# Patient Record
Sex: Female | Born: 1970
Health system: Southern US, Community
[De-identification: ages and names within clinical notes are randomized; demographics above are authoritative.]

## PROBLEM LIST (undated history)

## (undated) DIAGNOSIS — I10 Essential (primary) hypertension: Secondary | ICD-10-CM

---

## 2018-12-05 DIAGNOSIS — E785 Hyperlipidemia, unspecified: Secondary | ICD-10-CM | POA: Diagnosis not present

## 2018-12-05 DIAGNOSIS — R42 Dizziness and giddiness: Secondary | ICD-10-CM | POA: Diagnosis not present

## 2018-12-05 DIAGNOSIS — I10 Essential (primary) hypertension: Secondary | ICD-10-CM | POA: Diagnosis not present

## 2018-12-29 DIAGNOSIS — Z01419 Encounter for gynecological examination (general) (routine) without abnormal findings: Secondary | ICD-10-CM | POA: Diagnosis not present

## 2019-01-03 ENCOUNTER — Other Ambulatory Visit: Payer: Self-pay | Admitting: Obstetrics & Gynecology

## 2019-01-03 DIAGNOSIS — Z1231 Encounter for screening mammogram for malignant neoplasm of breast: Secondary | ICD-10-CM

## 2019-02-23 ENCOUNTER — Other Ambulatory Visit: Payer: Self-pay

## 2019-02-23 ENCOUNTER — Ambulatory Visit
Admission: RE | Admit: 2019-02-23 | Discharge: 2019-02-23 | Disposition: A | Discharge status: Home / Self Care | Payer: Self-pay | Source: Ambulatory Visit | Attending: Obstetrics & Gynecology | Admitting: Obstetrics & Gynecology

## 2019-02-23 DIAGNOSIS — Z1231 Encounter for screening mammogram for malignant neoplasm of breast: Secondary | ICD-10-CM | POA: Diagnosis not present

## 2019-06-07 DIAGNOSIS — Z0000 Encounter for general adult medical examination without abnormal findings: Secondary | ICD-10-CM | POA: Diagnosis not present

## 2019-06-07 DIAGNOSIS — E785 Hyperlipidemia, unspecified: Secondary | ICD-10-CM | POA: Diagnosis not present

## 2019-06-07 DIAGNOSIS — E559 Vitamin D deficiency, unspecified: Secondary | ICD-10-CM | POA: Diagnosis not present

## 2019-06-07 DIAGNOSIS — I10 Essential (primary) hypertension: Secondary | ICD-10-CM | POA: Diagnosis not present

## 2019-06-09 ENCOUNTER — Ambulatory Visit: Payer: BC Managed Care – PPO | Attending: Internal Medicine

## 2019-06-09 DIAGNOSIS — Z23 Encounter for immunization: Secondary | ICD-10-CM

## 2019-06-09 NOTE — Progress Notes (Signed)
   Covid-19 Vaccination Clinic  Name:  Christine Murray    MRN: [id number] DOB: December 07, 1970  06/09/2019  [name] was observed post Covid-19 immunization for 15 minutes without incident. She was provided with Vaccine Information Sheet and instruction to access the V-Safe system.   [name] was instructed to call 911 with any severe reactions post vaccine: Marland Kitchen Difficulty breathing  . Swelling of face and throat  . A fast heartbeat  . A bad rash all over body  . Dizziness and weakness   Immunizations Administered    Name Date Dose VIS Date Route   Pfizer COVID-19 Vaccine 06/09/2019  4:43 PM 0.3 mL 02/03/2019 Intramuscular   Manufacturer: [place]   Lot: [id number]   [place]: [id number]

## 2019-07-03 ENCOUNTER — Ambulatory Visit: Payer: BC Managed Care – PPO | Attending: Internal Medicine

## 2019-07-03 DIAGNOSIS — Z23 Encounter for immunization: Secondary | ICD-10-CM

## 2019-07-03 NOTE — Progress Notes (Signed)
   Covid-19 Vaccination Clinic  Name:  Christine Murray    MRN: [id number] DOB: 1970/05/21  07/03/2019  [name] was observed post Covid-19 immunization for 15 minutes without incident. She was provided with Vaccine Information Sheet and instruction to access the V-Safe system.   [name] was instructed to call 911 with any severe reactions post vaccine: Marland Kitchen Difficulty breathing  . Swelling of face and throat  . A fast heartbeat  . A bad rash all over body  . Dizziness and weakness   Immunizations Administered    Name Date Dose VIS Date Route   Pfizer COVID-19 Vaccine 07/03/2019  4:13 PM 0.3 mL 04/19/2018 Intramuscular   Manufacturer: [place]   Lot: [id number]   [place]: [id number]

## 2019-12-07 DIAGNOSIS — E785 Hyperlipidemia, unspecified: Secondary | ICD-10-CM | POA: Diagnosis not present

## 2019-12-07 DIAGNOSIS — I10 Essential (primary) hypertension: Secondary | ICD-10-CM | POA: Diagnosis not present

## 2020-03-06 ENCOUNTER — Other Ambulatory Visit: Payer: Self-pay | Admitting: Obstetrics and Gynecology

## 2020-03-06 DIAGNOSIS — Z1231 Encounter for screening mammogram for malignant neoplasm of breast: Secondary | ICD-10-CM

## 2020-03-06 DIAGNOSIS — Z01419 Encounter for gynecological examination (general) (routine) without abnormal findings: Secondary | ICD-10-CM | POA: Diagnosis not present

## 2020-03-13 DIAGNOSIS — N951 Menopausal and female climacteric states: Secondary | ICD-10-CM | POA: Diagnosis not present

## 2020-03-13 DIAGNOSIS — N939 Abnormal uterine and vaginal bleeding, unspecified: Secondary | ICD-10-CM | POA: Diagnosis not present

## 2020-03-27 DIAGNOSIS — N939 Abnormal uterine and vaginal bleeding, unspecified: Secondary | ICD-10-CM | POA: Diagnosis not present

## 2020-04-22 ENCOUNTER — Ambulatory Visit: Payer: BC Managed Care – PPO

## 2020-05-15 ENCOUNTER — Other Ambulatory Visit: Payer: Self-pay | Admitting: Gastroenterology

## 2020-05-15 DIAGNOSIS — Z01818 Encounter for other preprocedural examination: Secondary | ICD-10-CM | POA: Diagnosis not present

## 2020-05-27 DIAGNOSIS — N939 Abnormal uterine and vaginal bleeding, unspecified: Secondary | ICD-10-CM | POA: Diagnosis not present

## 2020-05-27 DIAGNOSIS — R42 Dizziness and giddiness: Secondary | ICD-10-CM | POA: Diagnosis not present

## 2020-05-27 DIAGNOSIS — I10 Essential (primary) hypertension: Secondary | ICD-10-CM | POA: Diagnosis not present

## 2020-05-27 DIAGNOSIS — E7800 Pure hypercholesterolemia, unspecified: Secondary | ICD-10-CM | POA: Diagnosis not present

## 2020-06-12 ENCOUNTER — Ambulatory Visit
Admission: RE | Admit: 2020-06-12 | Discharge: 2020-06-12 | Disposition: A | Discharge status: Home / Self Care | Payer: BC Managed Care – PPO | Source: Ambulatory Visit | Attending: Obstetrics and Gynecology | Admitting: Obstetrics and Gynecology

## 2020-06-12 ENCOUNTER — Other Ambulatory Visit: Payer: Self-pay

## 2020-06-12 DIAGNOSIS — Z1231 Encounter for screening mammogram for malignant neoplasm of breast: Secondary | ICD-10-CM | POA: Diagnosis not present

## 2020-07-04 ENCOUNTER — Other Ambulatory Visit: Payer: Self-pay

## 2020-07-04 ENCOUNTER — Encounter (HOSPITAL_COMMUNITY): Payer: Self-pay | Admitting: Gastroenterology

## 2020-07-05 ENCOUNTER — Other Ambulatory Visit (HOSPITAL_COMMUNITY)
Admission: RE | Admit: 2020-07-05 | Discharge: 2020-07-05 | Disposition: A | Discharge status: Home / Self Care | Payer: BC Managed Care – PPO | Source: Ambulatory Visit | Attending: Gastroenterology | Admitting: Gastroenterology

## 2020-07-05 DIAGNOSIS — Z01812 Encounter for preprocedural laboratory examination: Secondary | ICD-10-CM | POA: Diagnosis not present

## 2020-07-05 DIAGNOSIS — Z20822 Contact with and (suspected) exposure to covid-19: Secondary | ICD-10-CM | POA: Insufficient documentation

## 2020-07-06 LAB — SARS CORONAVIRUS 2 (TAT 6-24 HRS): SARS Coronavirus 2: NEGATIVE

## 2020-07-08 NOTE — H&P (Signed)
History of Present Illness  General:          50 year old female is referred to schedule her first screening colonoscopy due to age. She has never had a colonoscopy. She denies any GI symptoms such as abdominal pain, heartburn, dysphagia, bowel irregularities, or rectal bleeding. Family history significant for maternal grandmother who had colon cancer in her late 47s. Mother had colon polyps. Patient has morbid obesity. BMI is 52. She is not on any blood thinners. No heart or lung problems.     Current Medications  Taking  .Atorvastatin Calcium 40 mg Tablet TAKE 1 TABLET DAILY     .Lisinopril-hydroCHLOROthiazide 10-12.5 MG Tablet 1 tablet Orally Once a day    .Magnesium 500 MG Tablet 1 tablet with a meal Orally Once a day, Notes: unsure of dosage    .Multivitamin - Tablet 1 tablet Orally Once a day    .Vitamin D 125 MCG (5000 UT) Capsule 1 tablet Orally Once a day    .Omega 3 1000 MG Capsule 1 capsule Orally Once a day    Medication List reviewed and reconciled with the patient         Past Medical History        HTN.         Hypercholesterlemia.         Vertigo.         Morbid Obesity.        Surgical History         C Section-2          2 Carpal Tunnel Release          Tubal Ligation        Family History   Father: alive, diagnosed with Diabetes, Prostate CA   Mother: alive, CVS at age 24, stroke, Colon Polyps, diagnosed with Hypertension, CVA   Maternal Grand Mother: deceased, diagnosed with Diabetes, Colon cancer   Maternal aunt: diagnosed with Breast cancer   2 brother(s) , 1 sister(s) - healthy. 1 son(s) , 1 daughter(s) - healthy.        Social History  General:   Tobacco use      cigarettes: Never smoked    Tobacco history last updated 05/15/2020    Vaping No Alcohol: yes, occasionally.  Caffeine: yes, 1 serving daily, coffee.  no Recreational drug use.  DIET: no particular dietary program, more plant based diet.  Exercise: yes,  5x weekly.  Marital Status: single.  Children: 2.      Allergies   N.K.D.A.       Hospitalization/Major Diagnostic Procedure   child birth    Not in the past year 05/15/20      Review of Systems  GI PROCEDURE:         Pacemaker/ AICD no. Artificial heart valves no. MI/heart attack no. Abnormal heart rhythm no. Angina no. CVA no. Hypertension YES. Hypotension no. Asthma, COPD no. Sleep apnea no. Seizure disorders no. Artificial joints no. Severe DJD no. Diabetes no. Significant headaches no. Vertigo YES. Depression/anxiety no. Abnormal bleeding no. Kidney Disease no. Liver disease no. Chance of pregnancy no. Blood transfusion YESx1 at child birth in 2000.       Vital Signs  Wt 259.2, Wt change -.5 lb, Ht 59, BMI 52.35, Temp 98.3, Pulse sitting 71, BP sitting 160/80, Oxygen sat % 97.     Examination  Gastroenterology Exam:       GENERAL APPEARANCE: Well developed, well nourished, obese, no active distress, pleasant, no acute distress.  SCLERA: anicteric.        RESPIRATORY Breath sounds clear to auscultation. No wheezes, rales or rhonchi. Respiration even and unlabored.        CARDIOVASCULAR Normal RRR w/o murmers or gallops. No peripheral edema.        ABDOMEN No masses palpated. Liver and spleen not palpated, normal. Bowel sounds normal, Abdomen not distended, soft, nontender.        PSYCHIATRIC Alert and oriented x3, mood and affect appear normal..       Assessments     1. Colon cancer screening - Z12.11 (Primary)   2. Morbid obesity - E66.01     Treatment   1. Colon cancer screening        IMAGING: Colonoscopy              Johnson,Shannon 05/15/2020 05:04:16 PM > , Pt scheduled 07-09-20. COVID test 07-05-20. RX given for prep. Consent form signed. Instructions, bill of rights, and video instructions given to pt. Spoke with Myriam Jacobson at [employer], [place] #[id number]   Notes: I discussed risks and benefits of colonoscopy procedure with patient to include risk of  bleeding or colon perforation. Patient expressed understanding and agrees to proceed with procedure. **Schedule colonoscopy in hospital due to BMI greater than 50.

## 2020-07-08 NOTE — Anesthesia Preprocedure Evaluation (Addendum)
Anesthesia Evaluation  Patient identified by MRN, date of birth, ID band  Reviewed: Allergy & Precautions, NPO status , Patient's Chart, lab work & pertinent test results  Airway Mallampati: II  TM Distance: >3 FB Neck ROM: Full    Dental no notable dental hx. (+) Teeth Intact, Dental Advisory Given   Pulmonary neg pulmonary ROS,    Pulmonary exam normal breath sounds clear to auscultation       Cardiovascular hypertension, Normal cardiovascular exam Rhythm:Regular Rate:Normal     Neuro/Psych negative neurological ROS     GI/Hepatic negative GI ROS, Neg liver ROS,   Endo/Other  negative endocrine ROS  Renal/GU negative Renal ROS     Musculoskeletal negative musculoskeletal ROS (+)   Abdominal (+) + obese,   Peds  Hematology   Anesthesia Other Findings   Reproductive/Obstetrics                           Anesthesia Physical Anesthesia Plan  ASA: III  Anesthesia Plan: MAC   Post-op Pain Management:    Induction:   PONV Risk Score and Plan: Treatment may vary due to age or medical condition  Airway Management Planned: Natural Airway and Nasal Cannula  Additional Equipment: None  Intra-op Plan:   Post-operative Plan:   Informed Consent: I have reviewed the patients History and Physical, chart, labs and discussed the procedure including the risks, benefits and alternatives for the proposed anesthesia with the patient or authorized representative who has indicated his/her understanding and acceptance.     Dental advisory given  Plan Discussed with: CRNA and Anesthesiologist  Anesthesia Plan Comments: (Screening colonoscopy)       Anesthesia Quick Evaluation

## 2020-07-09 ENCOUNTER — Ambulatory Visit (HOSPITAL_COMMUNITY): Payer: BC Managed Care – PPO | Admitting: Certified Registered"

## 2020-07-09 ENCOUNTER — Encounter (HOSPITAL_COMMUNITY)
Admission: RE | Disposition: A | Discharge status: Home / Self Care | Payer: Self-pay | Source: Home / Self Care | Attending: Gastroenterology

## 2020-07-09 ENCOUNTER — Other Ambulatory Visit: Payer: Self-pay

## 2020-07-09 ENCOUNTER — Ambulatory Visit (HOSPITAL_COMMUNITY)
Admission: RE | Admit: 2020-07-09 | Discharge: 2020-07-09 | Disposition: A | Discharge status: Home / Self Care | Payer: BC Managed Care – PPO | Attending: Gastroenterology | Admitting: Gastroenterology

## 2020-07-09 ENCOUNTER — Encounter (HOSPITAL_COMMUNITY): Payer: Self-pay | Admitting: Gastroenterology

## 2020-07-09 DIAGNOSIS — K635 Polyp of colon: Secondary | ICD-10-CM | POA: Diagnosis not present

## 2020-07-09 DIAGNOSIS — Z1211 Encounter for screening for malignant neoplasm of colon: Secondary | ICD-10-CM | POA: Diagnosis not present

## 2020-07-09 DIAGNOSIS — K648 Other hemorrhoids: Secondary | ICD-10-CM | POA: Insufficient documentation

## 2020-07-09 DIAGNOSIS — K5730 Diverticulosis of large intestine without perforation or abscess without bleeding: Secondary | ICD-10-CM | POA: Diagnosis not present

## 2020-07-09 DIAGNOSIS — Z800 Family history of malignant neoplasm of digestive organs: Secondary | ICD-10-CM | POA: Insufficient documentation

## 2020-07-09 DIAGNOSIS — Z8371 Family history of colonic polyps: Secondary | ICD-10-CM | POA: Diagnosis not present

## 2020-07-09 DIAGNOSIS — Z79899 Other long term (current) drug therapy: Secondary | ICD-10-CM | POA: Diagnosis not present

## 2020-07-09 DIAGNOSIS — Z6841 Body Mass Index (BMI) 40.0 and over, adult: Secondary | ICD-10-CM | POA: Diagnosis not present

## 2020-07-09 DIAGNOSIS — D122 Benign neoplasm of ascending colon: Secondary | ICD-10-CM | POA: Insufficient documentation

## 2020-07-09 HISTORY — PX: POLYPECTOMY: SHX5525

## 2020-07-09 HISTORY — PX: COLONOSCOPY WITH PROPOFOL: SHX5780

## 2020-07-09 HISTORY — DX: Essential (primary) hypertension: I10

## 2020-07-09 SURGERY — COLONOSCOPY WITH PROPOFOL
Anesthesia: Monitor Anesthesia Care

## 2020-07-09 MED ORDER — LACTATED RINGERS IV SOLN
INTRAVENOUS | Status: DC
  Administered 2020-07-09: 1000 mL via INTRAVENOUS

## 2020-07-09 MED ORDER — PROPOFOL 10 MG/ML IV BOLUS
INTRAVENOUS | Status: DC | PRN
  Administered 2020-07-09 (×2): 20 mg via INTRAVENOUS

## 2020-07-09 MED ORDER — PROPOFOL 500 MG/50ML IV EMUL
INTRAVENOUS | Status: DC | PRN
  Administered 2020-07-09: 150 ug/kg/min via INTRAVENOUS

## 2020-07-09 MED ORDER — LIDOCAINE 2% (20 MG/ML) 5 ML SYRINGE
INTRAMUSCULAR | Status: DC | PRN
  Administered 2020-07-09: 40 mg via INTRAVENOUS

## 2020-07-09 MED ORDER — PROPOFOL 1000 MG/100ML IV EMUL
INTRAVENOUS | Status: AC
  Filled 2020-07-09: qty 100

## 2020-07-09 MED ORDER — SODIUM CHLORIDE 0.9 % IV SOLN: INTRAVENOUS | Status: DC

## 2020-07-09 MED ORDER — PROPOFOL 10 MG/ML IV BOLUS
INTRAVENOUS | Status: AC
  Filled 2020-07-09: qty 20

## 2020-07-09 SURGICAL SUPPLY — 21 items

## 2020-07-09 NOTE — Anesthesia Postprocedure Evaluation (Signed)
Anesthesia Post Note  Patient: Christine Murray  Procedure(s) Performed: COLONOSCOPY WITH PROPOFOL (N/A ) POLYPECTOMY     Patient location during evaluation: Endoscopy Anesthesia Type: MAC Level of consciousness: awake and alert Pain management: pain level controlled Vital Signs Assessment: post-procedure vital signs reviewed and stable Respiratory status: spontaneous breathing, nonlabored ventilation, respiratory function stable and patient connected to nasal cannula oxygen Cardiovascular status: blood pressure returned to baseline and stable Postop Assessment: no apparent nausea or vomiting Anesthetic complications: no   No complications documented.  Last Vitals:  Vitals:   07/09/20 0950 07/09/20 0951  BP: (!) 141/74 (!) 145/86  Pulse: 61   Resp: (!) 21 20  Temp:    SpO2: 97%     Last Pain:  Vitals:   07/09/20 0951  TempSrc:   PainSc: 0-No pain                 [name]

## 2020-07-09 NOTE — Op Note (Signed)
[facility] Patient Name: Christine Murray Procedure Date: 07/09/2020 MRN: [id number] Attending MD: Ronnette Juniper , MD Date of Birth: 07-Mar-1970 CSN: [id number] Age: 50 Admit Type: Outpatient Procedure:                Colonoscopy Indications:              Screening for colorectal malignant neoplasm, This                            is the patient's first colonoscopy Providers:                Ronnette Juniper, MD, Nelia Shi, RN, Benetta Spar, Technician Referring MD:             Janyth Pupa, DO Medicines:                Monitored Anesthesia Care Complications:            No immediate complications. Estimated blood loss:                            Minimal. Estimated Blood Loss:     Estimated blood loss was minimal. Procedure:                Pre-Anesthesia Assessment:                           - Prior to the procedure, a History and Physical                            was performed, and patient medications and                            allergies were reviewed. The patient's tolerance of                            previous anesthesia was also reviewed. The risks                            and benefits of the procedure and the sedation                            options and risks were discussed with the patient.                            All questions were answered, and informed consent                            was obtained. Prior Anticoagulants: The patient has                            taken no previous anticoagulant or antiplatelet                            agents. ASA Grade  Assessment: II - A patient with                            mild systemic disease. After reviewing the risks                            and benefits, the patient was deemed in                            satisfactory condition to undergo the procedure.                           After obtaining informed consent, the colonoscope                            was passed under  direct vision. Throughout the                            procedure, the patient's blood pressure, pulse, and                            oxygen saturations were monitored continuously. The                            [id number]-[id number] ([id number]) Olympus pediatric colonscope                            was introduced through the anus and advanced to the                            the terminal ileum. The colonoscopy was performed                            without difficulty. The patient tolerated the                            procedure well. The quality of the bowel                            preparation was good. Scope In: 9:13:07 AM Scope Out: 9:32:06 AM Scope Withdrawal Time: 0 hours 16 minutes 34 seconds  Total Procedure Duration: 0 hours 18 minutes 59 seconds  Findings:      The perianal and digital rectal examinations were normal.      The terminal ileum appeared normal.      Two sessile polyps were found in the ascending colon. The polyps were 5       to 6 mm in size. These polyps were removed with a cold biopsy forceps.       Resection and retrieval were complete.      A 4 mm polyp was found in the descending colon. The polyp was sessile.       The polyp was removed with a cold biopsy forceps. Resection and       retrieval were complete.      A few small-mouthed diverticula were found in the sigmoid  colon and       descending colon.      Non-bleeding internal hemorrhoids were found during retroflexion. Impression:               - The examined portion of the ileum was normal.                           - Two 5 to 6 mm polyps in the ascending colon,                            removed with a cold biopsy forceps. Resected and                            retrieved.                           - One 4 mm polyp in the descending colon, removed                            with a cold biopsy forceps. Resected and retrieved.                           - Diverticulosis in the sigmoid colon and in the                             descending colon.                           - Non-bleeding internal hemorrhoids. Moderate Sedation:      Patient did not receive moderate sedation for this procedure, but       instead received monitored anesthesia care. Recommendation:           - Patient has a contact number available for                            emergencies. The signs and symptoms of potential                            delayed complications were discussed with the                            patient. Return to normal activities tomorrow.                            Written discharge instructions were provided to the                            patient.                           - Resume regular diet.                           - Continue present medications.                           -  Await pathology results.                           - Repeat colonoscopy for surveillance based on                            pathology results. Procedure Code(s):        --- Professional ---                           [phone], Colonoscopy, flexible; with biopsy, single                            or multiple Diagnosis Code(s):        --- Professional ---                           K63.5, Polyp of colon                           Z12.11, Encounter for screening for malignant                            neoplasm of colon                           K64.8, Other hemorrhoids                           K57.30, Diverticulosis of large intestine without                            perforation or abscess without bleeding CPT copyright 2019 American Medical Association. All rights reserved. The codes documented in this report are preliminary and upon coder review may  be revised to meet current compliance requirements. Ronnette Juniper, MD 07/09/2020 9:34:25 AM This report has been signed electronically. Number of Addenda: 0

## 2020-07-09 NOTE — Transfer of Care (Signed)
Immediate Anesthesia Transfer of Care Note  Patient: Christine Murray  Procedure(s) Performed: COLONOSCOPY WITH PROPOFOL (N/A ) POLYPECTOMY  Patient Location: [facility]   Anesthesia Type:MAC  Level of Consciousness: awake, alert  and patient cooperative  Airway & Oxygen Therapy: Patient Spontanous Breathing and Patient connected to face mask oxygen  Post-op Assessment: Report given to RN and Post -op Vital signs reviewed and stable  Post vital signs: Reviewed and stable  Last Vitals:  Vitals Value Taken Time  BP 145/86 07/09/20 0951  Temp 36.6 C 07/09/20 0936  Pulse 61 07/09/20 0950  Resp 16 07/09/20 0952  SpO2 97 % 07/09/20 0950  Vitals shown include unvalidated device data.  Last Pain:  Vitals:   07/09/20 0936  TempSrc: Oral  PainSc: 0-No pain         Complications: No complications documented.

## 2020-07-09 NOTE — Anesthesia Procedure Notes (Signed)
Procedure Name: MAC Date/Time: 07/09/2020 9:07 AM Performed by: Eben Burow, CRNA Pre-anesthesia Checklist: Patient identified, Emergency Drugs available, Suction available, Patient being monitored and Timeout performed Oxygen Delivery Method: Simple face mask Placement Confirmation: positive ETCO2

## 2020-07-09 NOTE — Discharge Instructions (Signed)

## 2020-07-10 LAB — SURGICAL PATHOLOGY

## 2020-07-11 ENCOUNTER — Encounter (HOSPITAL_COMMUNITY): Payer: Self-pay | Admitting: Gastroenterology

## 2020-10-08 DIAGNOSIS — Z20822 Contact with and (suspected) exposure to covid-19: Secondary | ICD-10-CM | POA: Diagnosis not present

## 2021-03-07 DIAGNOSIS — Z01419 Encounter for gynecological examination (general) (routine) without abnormal findings: Secondary | ICD-10-CM | POA: Diagnosis not present

## 2021-03-07 DIAGNOSIS — Z124 Encounter for screening for malignant neoplasm of cervix: Secondary | ICD-10-CM | POA: Diagnosis not present

## 2021-05-02 ENCOUNTER — Other Ambulatory Visit: Payer: Self-pay | Admitting: Obstetrics and Gynecology

## 2021-05-02 DIAGNOSIS — Z1231 Encounter for screening mammogram for malignant neoplasm of breast: Secondary | ICD-10-CM

## 2021-06-13 ENCOUNTER — Ambulatory Visit
Admission: RE | Admit: 2021-06-13 | Discharge: 2021-06-13 | Disposition: A | Discharge status: Home / Self Care | Payer: BC Managed Care – PPO | Source: Ambulatory Visit | Attending: Obstetrics and Gynecology | Admitting: Obstetrics and Gynecology

## 2021-06-13 DIAGNOSIS — Z1231 Encounter for screening mammogram for malignant neoplasm of breast: Secondary | ICD-10-CM | POA: Diagnosis not present

## 2021-06-16 DIAGNOSIS — I10 Essential (primary) hypertension: Secondary | ICD-10-CM | POA: Diagnosis not present

## 2021-06-16 DIAGNOSIS — E785 Hyperlipidemia, unspecified: Secondary | ICD-10-CM | POA: Diagnosis not present

## 2021-11-28 DIAGNOSIS — I10 Essential (primary) hypertension: Secondary | ICD-10-CM | POA: Diagnosis not present

## 2021-12-02 DIAGNOSIS — E785 Hyperlipidemia, unspecified: Secondary | ICD-10-CM | POA: Diagnosis not present

## 2021-12-02 DIAGNOSIS — R739 Hyperglycemia, unspecified: Secondary | ICD-10-CM | POA: Diagnosis not present

## 2021-12-02 DIAGNOSIS — I10 Essential (primary) hypertension: Secondary | ICD-10-CM | POA: Diagnosis not present

## 2021-12-02 DIAGNOSIS — Z0000 Encounter for general adult medical examination without abnormal findings: Secondary | ICD-10-CM | POA: Diagnosis not present

## 2022-03-23 DIAGNOSIS — Z124 Encounter for screening for malignant neoplasm of cervix: Secondary | ICD-10-CM | POA: Diagnosis not present

## 2022-03-23 DIAGNOSIS — Z01419 Encounter for gynecological examination (general) (routine) without abnormal findings: Secondary | ICD-10-CM | POA: Diagnosis not present

## 2022-03-23 DIAGNOSIS — N898 Other specified noninflammatory disorders of vagina: Secondary | ICD-10-CM | POA: Diagnosis not present

## 2022-04-27 DIAGNOSIS — B977 Papillomavirus as the cause of diseases classified elsewhere: Secondary | ICD-10-CM | POA: Diagnosis not present

## 2022-04-27 DIAGNOSIS — R87810 Cervical high risk human papillomavirus (HPV) DNA test positive: Secondary | ICD-10-CM | POA: Diagnosis not present

## 2022-04-27 DIAGNOSIS — N898 Other specified noninflammatory disorders of vagina: Secondary | ICD-10-CM | POA: Diagnosis not present

## 2022-04-27 DIAGNOSIS — Z3202 Encounter for pregnancy test, result negative: Secondary | ICD-10-CM | POA: Diagnosis not present

## 2022-04-30 ENCOUNTER — Other Ambulatory Visit: Payer: Self-pay | Admitting: Obstetrics and Gynecology

## 2022-04-30 DIAGNOSIS — Z1231 Encounter for screening mammogram for malignant neoplasm of breast: Secondary | ICD-10-CM

## 2022-06-23 ENCOUNTER — Ambulatory Visit
Admission: RE | Admit: 2022-06-23 | Discharge: 2022-06-23 | Disposition: A | Discharge status: Home / Self Care | Payer: BC Managed Care – PPO | Source: Ambulatory Visit | Attending: Obstetrics and Gynecology | Admitting: Obstetrics and Gynecology

## 2022-06-23 DIAGNOSIS — Z1231 Encounter for screening mammogram for malignant neoplasm of breast: Secondary | ICD-10-CM

## 2022-12-07 DIAGNOSIS — Z0000 Encounter for general adult medical examination without abnormal findings: Secondary | ICD-10-CM | POA: Diagnosis not present

## 2022-12-07 DIAGNOSIS — I10 Essential (primary) hypertension: Secondary | ICD-10-CM | POA: Diagnosis not present

## 2022-12-07 DIAGNOSIS — E785 Hyperlipidemia, unspecified: Secondary | ICD-10-CM | POA: Diagnosis not present

## 2022-12-25 DIAGNOSIS — E785 Hyperlipidemia, unspecified: Secondary | ICD-10-CM | POA: Diagnosis not present

## 2023-01-04 IMAGING — MG MM DIGITAL SCREENING BILAT W/ TOMO AND CAD
6 of 10 series · 6 of 30 positions shown · non-contrast
Comparison: Previous exam(s).

ACR Breast Density Category a: The breast tissue is almost entirely
fatty.

CLINICAL DATA: Screening.

EXAM:
DIGITAL SCREENING BILATERAL MAMMOGRAM WITH TOMOSYNTHESIS AND CAD
TECHNIQUE: Bilateral screening digital craniocaudal and mediolateral oblique
mammograms were obtained. Bilateral screening digital breast
tomosynthesis was performed. The images were evaluated with
computer-aided detection.

[R CC synth-2D]
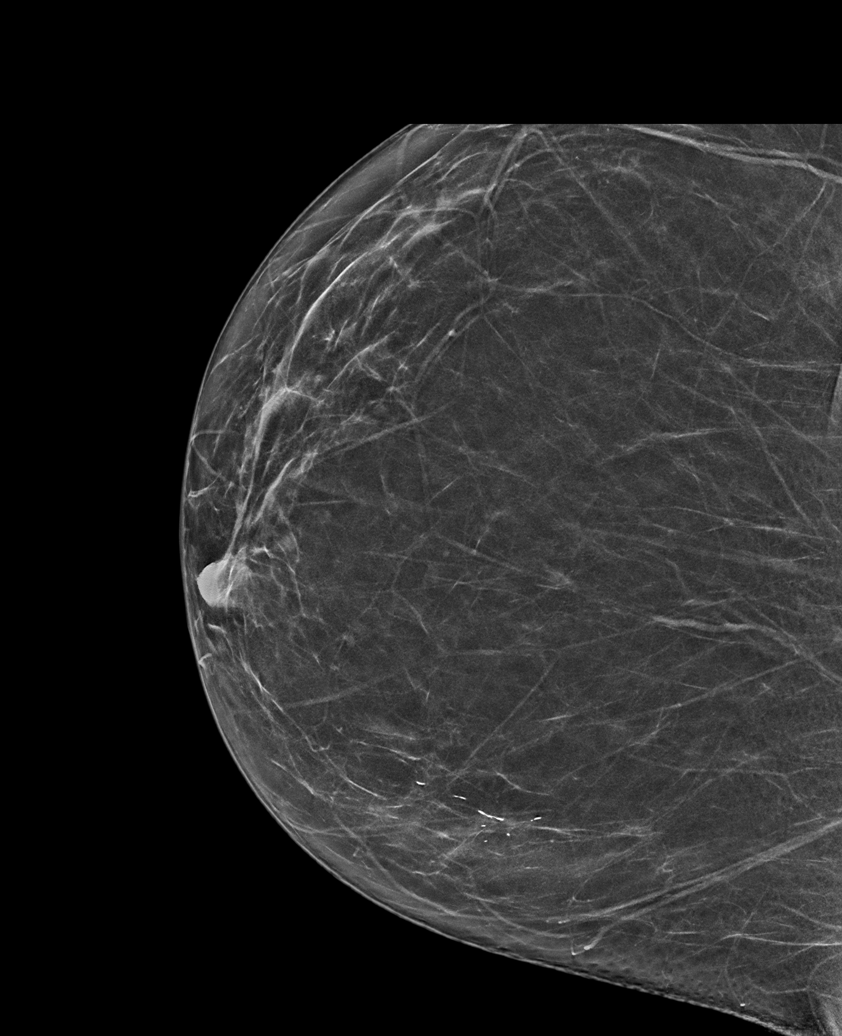

[R MLO synth-2D]
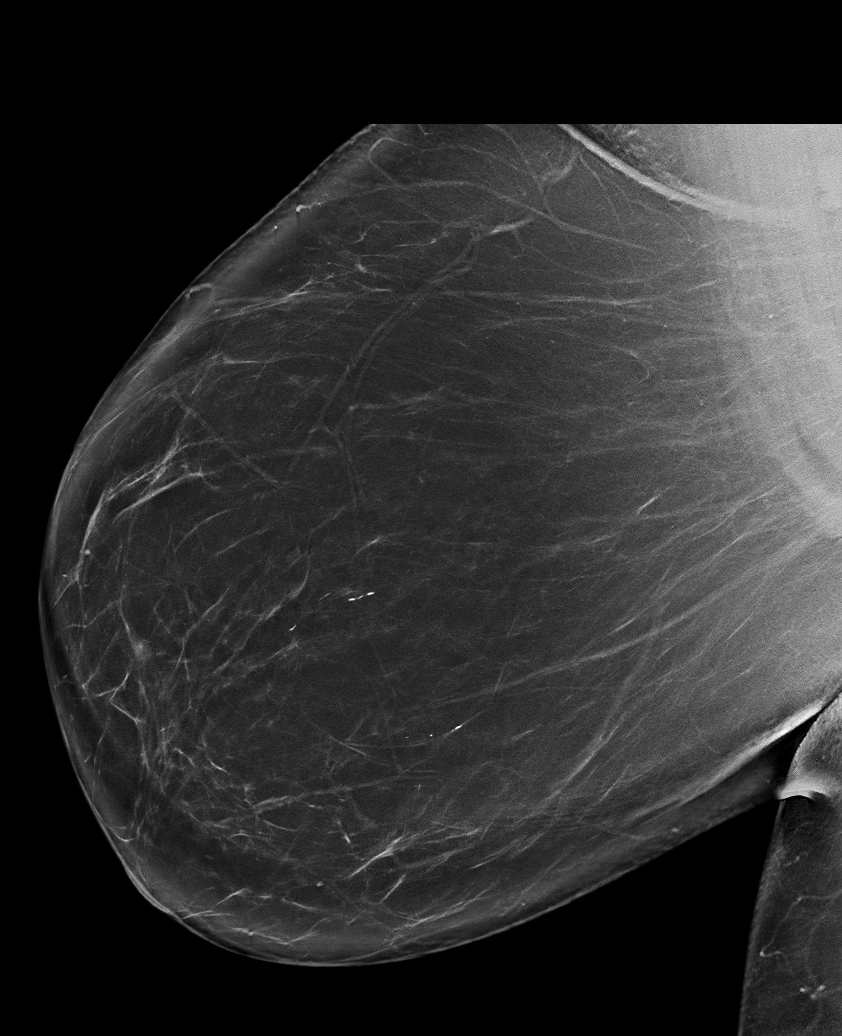

[L MLO synth-2D]
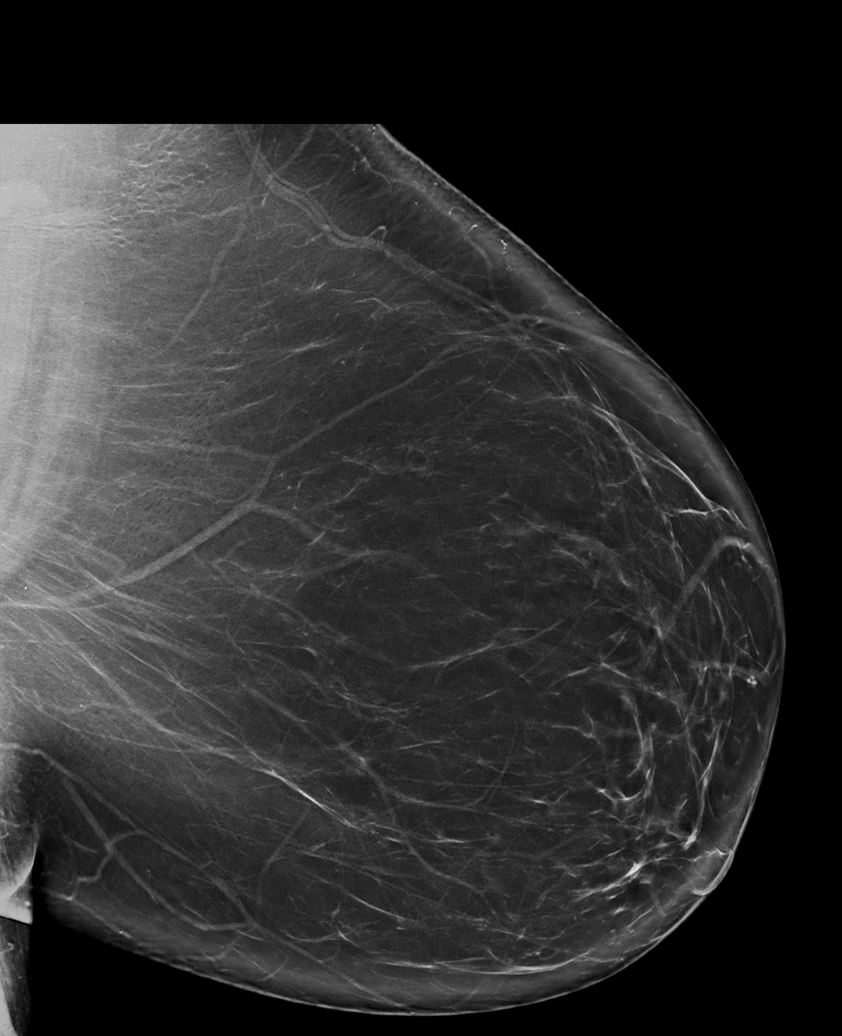

[L CC synth-2D]
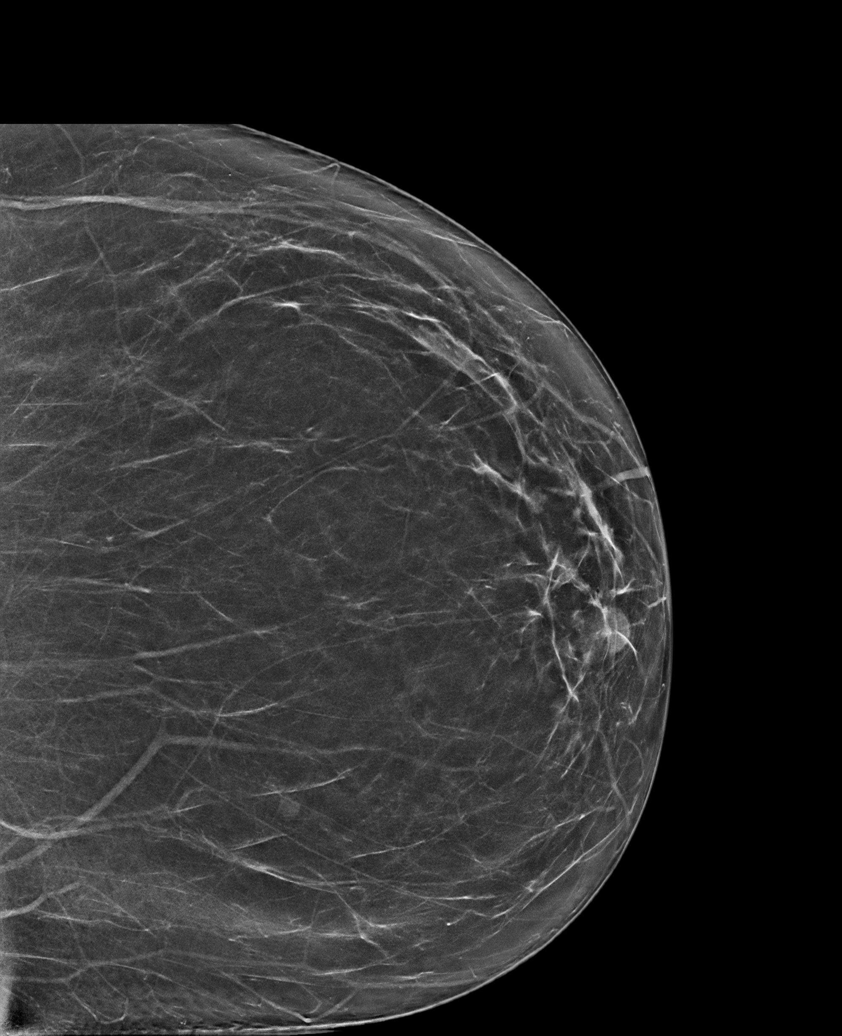

[R CV synth-2D]
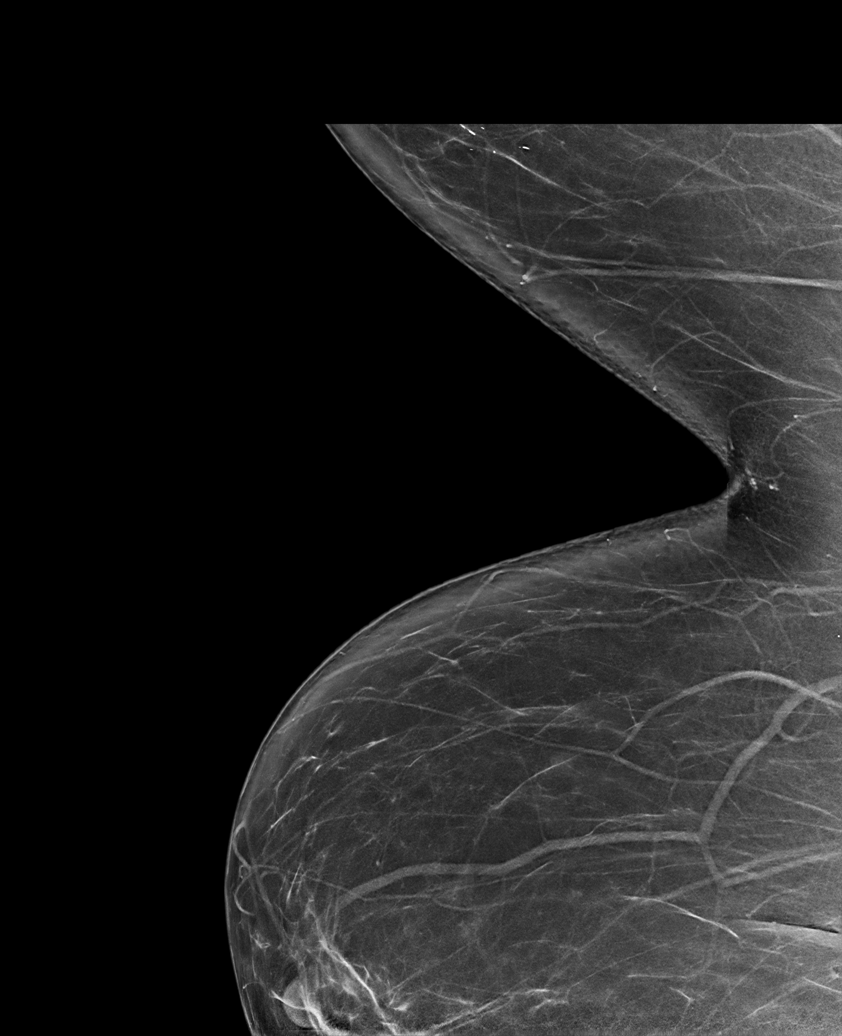

[L MLO tomo · tomo slice 49/96.0]
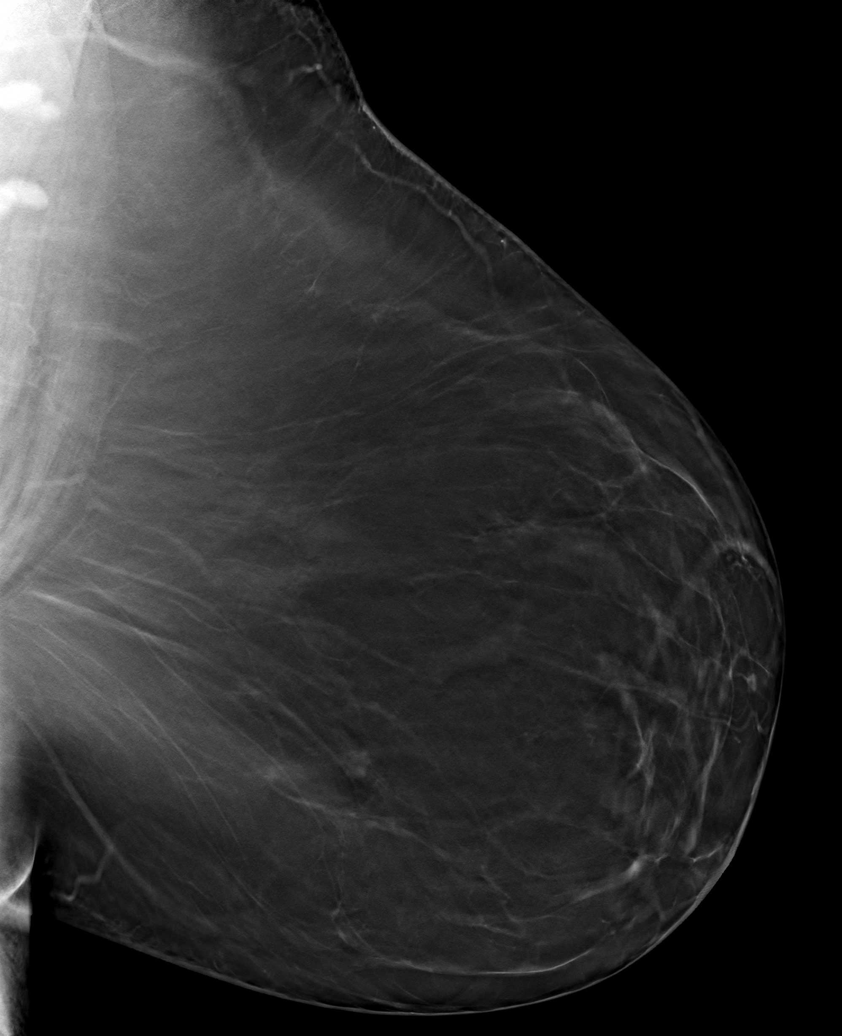

[6 of 30 positions shown; findings below may reference images not displayed]

FINDINGS: There are no findings suspicious for malignancy.
IMPRESSION: No mammographic evidence of malignancy. A result letter of this
screening mammogram will be mailed directly to the patient.

RECOMMENDATION:
Screening mammogram in one year. (Code:0E-3-N98)

BI-RADS CATEGORY  1: Negative.

## 2023-03-30 DIAGNOSIS — Z124 Encounter for screening for malignant neoplasm of cervix: Secondary | ICD-10-CM | POA: Diagnosis not present

## 2023-03-30 DIAGNOSIS — Z01419 Encounter for gynecological examination (general) (routine) without abnormal findings: Secondary | ICD-10-CM | POA: Diagnosis not present

## 2023-05-10 ENCOUNTER — Other Ambulatory Visit: Payer: Self-pay | Admitting: Obstetrics and Gynecology

## 2023-05-10 DIAGNOSIS — Z1231 Encounter for screening mammogram for malignant neoplasm of breast: Secondary | ICD-10-CM

## 2023-05-26 DIAGNOSIS — R87619 Unspecified abnormal cytological findings in specimens from cervix uteri: Secondary | ICD-10-CM | POA: Diagnosis not present

## 2023-06-24 ENCOUNTER — Ambulatory Visit
Admission: RE | Admit: 2023-06-24 | Discharge: 2023-06-24 | Disposition: A | Discharge status: Home / Self Care | Source: Ambulatory Visit | Attending: Obstetrics and Gynecology | Admitting: Obstetrics and Gynecology

## 2023-06-24 DIAGNOSIS — Z1231 Encounter for screening mammogram for malignant neoplasm of breast: Secondary | ICD-10-CM | POA: Diagnosis not present

## 2023-12-23 DIAGNOSIS — I10 Essential (primary) hypertension: Secondary | ICD-10-CM | POA: Diagnosis not present

## 2023-12-23 DIAGNOSIS — E1169 Type 2 diabetes mellitus with other specified complication: Secondary | ICD-10-CM | POA: Diagnosis not present

## 2023-12-23 DIAGNOSIS — Z0000 Encounter for general adult medical examination without abnormal findings: Secondary | ICD-10-CM | POA: Diagnosis not present

## 2023-12-23 DIAGNOSIS — E785 Hyperlipidemia, unspecified: Secondary | ICD-10-CM | POA: Diagnosis not present
